# Patient Record
Sex: Female | Born: 2000 | Hispanic: Yes | Marital: Single | Smoking: Never smoker
Health system: Southern US, Community
[De-identification: ages and names within clinical notes are randomized; demographics above are authoritative.]

## PROBLEM LIST (undated history)

## (undated) DIAGNOSIS — D649 Anemia, unspecified: Secondary | ICD-10-CM

## (undated) HISTORY — PX: OVARIAN CYST SURGERY: SHX726

## (undated) HISTORY — DX: Anemia, unspecified: D64.9

---
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
## 2022-12-13 ENCOUNTER — Ambulatory Visit: Payer: BC Managed Care – PPO | Admitting: Family Medicine

## 2022-12-13 ENCOUNTER — Encounter: Payer: Self-pay | Admitting: Family Medicine

## 2022-12-13 VITALS — BP 119/80 | HR 52 | Temp 97.9°F | Ht 63.0 in | Wt 191.0 lb

## 2022-12-13 DIAGNOSIS — Z0000 Encounter for general adult medical examination without abnormal findings: Secondary | ICD-10-CM | POA: Diagnosis not present

## 2022-12-13 DIAGNOSIS — N912 Amenorrhea, unspecified: Secondary | ICD-10-CM

## 2022-12-13 DIAGNOSIS — D649 Anemia, unspecified: Secondary | ICD-10-CM | POA: Insufficient documentation

## 2022-12-13 DIAGNOSIS — Z7689 Persons encountering health services in other specified circumstances: Secondary | ICD-10-CM

## 2022-12-13 DIAGNOSIS — R7303 Prediabetes: Secondary | ICD-10-CM | POA: Diagnosis not present

## 2022-12-13 DIAGNOSIS — Z23 Encounter for immunization: Secondary | ICD-10-CM | POA: Diagnosis not present

## 2022-12-13 DIAGNOSIS — Z8481 Family history of carrier of genetic disease: Secondary | ICD-10-CM | POA: Diagnosis not present

## 2022-12-13 NOTE — Patient Instructions (Addendum)
Work on diet first if stool becomes harder again; if no improvement, can use over-the-counter stool softener, such as docusate sodium (generic for Colace).  Since you have zyrtec at home, you can take one tab every night before bed. If this dries you out too much, you can take one every other night.  If you can find and send me your insurance medication formulary, that would helpful.

## 2022-12-13 NOTE — Progress Notes (Signed)
New patient visit   Patient: Ruth Finley   DOB: [birth year]   22 y.o. Female  MRN: [id number] Visit Date: 12/13/2022  Today's healthcare provider: Sherlyn Hay, DO   Chief Complaint  Patient presents with   Establish Care   Subjective    Ruth Finley is a 22 y.o. female who presents today as a new patient to establish care.  HPI  Patient has additional complaint of amenorrhea for 4-5 months.  Patient has never been sexually active.  She does have a history of ovarian cysts that required surgery at age 22. Periods mostly regular, occasionally a couple days late but still happen. Usually last 3 days, with moderate flow. Started workout routine (30 minutes M-F), skip a week, then go again. Has been eating more often at home.  Trying to incorporate fresher foods and specifically homemade foods. Has been avoiding fast foods; had gizzards with rice yesterday; chicken, asparagus, potatoes, mushrooms and cabbage. Started 3-4 weeks ago. Initially 195 lb, now 191 lbs.  Patient also notes that she has seen blood in her stool on and off.  She has not seen any recently but does admit it is only when she has a bowel movement and she does suffer from constipation. - streak of blood on the stool and sometimes notable on toilet paper when she wipes; it is bright red. - BMs 1-2 x per days now; was having BMs q3d when constipated - Type 3-4 on [place] Chart - Works 3-11 pm; drinks more water at work than at home (works M-F)  Mom has breast cancer currently; was diagnosed at 20 Yo. - BRCA positive  Patient also states that in the past she has been told she is pre-diabetic.  She had an A1C of 5.9 on 09/23/21.  Palpitations - had a few weeks back; hasn't had any since. - doesn't remember exactly what she was doing. Was having a fair amount of stress - is fairly stressed by work.  Feels tension in shoulder and neck; gets headaches. 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 Wears glasses  but does have itchy eyes sometimes after work; rinses them with water.    Past Medical History:  Diagnosis Date   Anemia    Past Surgical History:  Procedure Laterality Date   OVARIAN CYST SURGERY     Age 22   Family Status  Relation Name Status   Mother  (Not Specified)   Father  (Not Specified)   MGM  (Not Specified)   Family History  Problem Relation Age of Onset   Cancer Mother        breast. dx in her 71's   Diabetes Father    Cancer Maternal Grandmother        breast   Social History   Socioeconomic History   Marital status: Single    Spouse name: Not on file   Number of children: Not on file   Years of education: Not on file   Highest education level: Not on file  Occupational History   Not on file  Tobacco Use   Smoking status: Never   Smokeless tobacco: Never  Substance and Sexual Activity   Alcohol use: Never   Drug use: Never   Sexual activity: Never    Birth control/protection: None  Other Topics Concern   Not on file  Social History Narrative   Not on file   Social Determinants of Health   Financial Resource Strain: Not  on file  Food Insecurity: No Food Insecurity (12/13/2022)   Hunger Vital Sign    Worried About Running Out of Food in the Last Year: Never true    Ran Out of Food in the Last Year: Never true  Transportation Needs: No Transportation Needs (12/13/2022)   PRAPARE - Administrator, Civil Service (Medical): No    Lack of Transportation (Non-Medical): No  Physical Activity: Sufficiently Active (12/13/2022)   Exercise Vital Sign    Days of Exercise per Week: 5 days    Minutes of Exercise per Session: 30 min  Stress: Not on file  Social Connections: Not on file   No outpatient medications prior to visit.   No facility-administered medications prior to visit.   No Known Allergies  Immunization History  Administered Date(s) Administered   PFIZER(Purple Top)SARS-COV-2 Vaccination 09/23/2019, 10/16/2019    Health  Maintenance  Topic Date Due   HPV VACCINES (1 - 2-dose series) Never done   HIV Screening  Never done   Hepatitis C Screening  Never done   COVID-19 Vaccine (3 - 2023-24 season) 02/18/2022   DTaP/Tdap/Td (1 - Tdap) Never done   INFLUENZA VACCINE  01/19/2023    Patient Care Team: Mickie Bail, MD (Inactive) as PCP - General (Pediatrics)  Review of Systems  Constitutional: Negative.   HENT:  Positive for tinnitus.   Eyes:  Positive for itching.  Respiratory: Negative.    Cardiovascular:  Positive for palpitations.  Gastrointestinal:  Positive for blood in stool and constipation.  Endocrine: Negative.   Genitourinary: Negative.   Musculoskeletal: Negative.   Skin: Negative.   Allergic/Immunologic: Negative.   Neurological:  Positive for headaches.  Hematological: Negative.   Psychiatric/Behavioral: Negative.    All other systems reviewed and are negative.      Objective    BP 119/80 (BP Location: Left Arm, Patient Position: Sitting, Cuff Size: Normal)   Pulse (!) 52   Temp 97.9 F (36.6 C) (Oral)   Ht 5\' 3"  (1.6 m)   Wt 191 lb (86.6 kg)   SpO2 100%   BMI 33.83 kg/m    Physical Exam Vitals reviewed.  Constitutional:      General: She is not in acute distress.    Appearance: Normal appearance. She is well-developed. She is not diaphoretic.  HENT:     Head: Normocephalic and atraumatic.     Right Ear: Tympanic membrane, ear canal and external ear normal.     Left Ear: Tympanic membrane, ear canal and external ear normal.     Nose: Nose normal.     Mouth/Throat:     Mouth: Mucous membranes are moist.     Pharynx: Oropharynx is clear. No oropharyngeal exudate.  Eyes:     General: No scleral icterus.    Conjunctiva/sclera: Conjunctivae normal.     Pupils: Pupils are equal, round, and reactive to light.  Neck:     Thyroid: No thyromegaly.  Cardiovascular:     Rate and Rhythm: Normal rate and regular rhythm.     Pulses: Normal pulses.     Heart sounds:  Normal heart sounds. No murmur heard. Pulmonary:     Effort: Pulmonary effort is normal. No respiratory distress.     Breath sounds: Normal breath sounds. No wheezing or rales.  Abdominal:     General: There is no distension.     Palpations: Abdomen is soft.     Tenderness: There is no abdominal tenderness.  Musculoskeletal:  General: No deformity.     Cervical back: Neck supple.     Right lower leg: No edema.     Left lower leg: No edema.  Lymphadenopathy:     Cervical: No cervical adenopathy.  Skin:    General: Skin is warm and dry.     Findings: No rash.  Neurological:     Mental Status: She is alert and oriented to person, place, and time. Mental status is at baseline.     Gait: Gait normal.  Psychiatric:        Mood and Affect: Mood normal.        Behavior: Behavior normal.        Thought Content: Thought content normal.     Depression Screen    12/13/2022    8:53 AM 03/20/2020    4:16 PM  PHQ 2/9 Scores  PHQ - 2 Score 0 0  PHQ- 9 Score 0    No results found for any visits on 12/13/22.  Assessment & Plan     1. Establishing care with new doctor, encounter for  2. Annual physical exam Overall benign physical exam except as noted.  Routine and screening blood work as noted below. - Comprehensive metabolic panel - Lipid panel - TSH Rfx on Abnormal to Free T4 - HIV Antibody (routine testing w rflx) - HCV Ab w Reflex to Quant PCR  3. Prediabetes Patient has long-standing history of prediabetes (since 03/11/2015 first ABN A1c of 5.7). Will recheck level today.  Patient working to improve her diet, weight and exercise habits with good initial success. Congratulated her and encouraged her to continue. - Hemoglobin A1c  4. Family history of BRCA gene positive Mom is BRCA positive and diagnosed with breast cancer at 20 Yo. Ordered BRCA genetic screening. Patient will need annual mammograms, starting at 22 Yo. [facility] Comprehensive Panel  5. Amenorrhea,  unspecified Patient has long-standing history of insulin resistance but only recently developed amenorrhea. Will evaluate with lab work below. - Follicle stimulating hormone - Prolactin - Estradiol - TSH Rfx on Abnormal to Free T4 - Testosterone,Free and Total  6. Immunization due Patient is up-to-date on all vaccines except Meningococcal B. Will give today. - Meningococcal B, OMV   Return in about 6 months (around 06/14/2023) for Weight.     The entirety of the information documented in the History of Present Illness, Review of Systems and Physical Exam were personally obtained by me. Portions of this information were initially documented by the CMA, Adline Peals, and reviewed by me for thoroughness and accuracy.   I discussed the assessment and treatment plan with the patient  The patient was provided an opportunity to ask questions and all were answered. The patient agreed with the plan and demonstrated an understanding of the instructions.   The patient was advised to call back or seek an in-person evaluation if the symptoms worsen or if the condition fails to improve as anticipated.    Sherlyn Hay, DO  [facility] [phone] (phone) [phone] (fax)  [facility]

## 2022-12-14 LAB — COMPREHENSIVE METABOLIC PANEL
AST: 21 IU/L (ref 0–40)
Calcium: 9.7 mg/dL (ref 8.7–10.2)
Sodium: 140 mmol/L (ref 134–144)

## 2022-12-14 LAB — BRCASSURE COMPREHENSIVE PANEL

## 2022-12-14 LAB — PROLACTIN: Prolactin: 139 ng/mL — ABNORMAL HIGH (ref 4.8–33.4)

## 2022-12-15 LAB — COMPREHENSIVE METABOLIC PANEL
ALT: 24 IU/L (ref 0–32)
Albumin: 4.9 g/dL (ref 4.0–5.0)
Alkaline Phosphatase: 65 IU/L (ref 42–106)
Bilirubin Total: 1 mg/dL (ref 0.0–1.2)
Chloride: 102 mmol/L (ref 96–106)
Globulin, Total: 3 g/dL (ref 1.5–4.5)
Glucose: 86 mg/dL (ref 70–99)
Total Protein: 7.9 g/dL (ref 6.0–8.5)
eGFR: 104 mL/min/{1.73_m2} (ref >59)

## 2022-12-15 LAB — LIPID PANEL
Chol/HDL Ratio: 4.7 ratio — ABNORMAL HIGH (ref 0.0–4.4)
Cholesterol, Total: 182 mg/dL — ABNORMAL HIGH (ref 100–169)
HDL: 39 mg/dL — ABNORMAL LOW (ref >39)
LDL Chol Calc (NIH): 122 mg/dL — ABNORMAL HIGH (ref 0–109)

## 2022-12-15 LAB — HCV INTERPRETATION

## 2022-12-15 LAB — HEMOGLOBIN A1C
Est. average glucose Bld gHb Est-mCnc: 123 mg/dL
Hgb A1c MFr Bld: 5.9 % — ABNORMAL HIGH (ref 4.8–5.6)

## 2022-12-15 LAB — TSH RFX ON ABNORMAL TO FREE T4: TSH: 2.26 u[IU]/mL (ref 0.450–4.500)

## 2022-12-15 LAB — FOLLICLE STIMULATING HORMONE: FSH: 7.5 m[IU]/mL

## 2022-12-15 LAB — BRCASSURE COMPREHENSIVE PANEL

## 2022-12-15 LAB — HCV AB W REFLEX TO QUANT PCR: HCV Ab: NONREACTIVE

## 2022-12-15 LAB — TESTOSTERONE,FREE AND TOTAL: Testosterone, Free: 4.1 pg/mL

## 2022-12-15 LAB — ESTRADIOL: Estradiol: 16.3 pg/mL

## 2023-01-05 ENCOUNTER — Inpatient Hospital Stay: Payer: BC Managed Care – PPO | Attending: Oncology | Admitting: Licensed Clinical Social Worker

## 2023-01-05 ENCOUNTER — Inpatient Hospital Stay: Payer: BC Managed Care – PPO

## 2023-01-12 ENCOUNTER — Inpatient Hospital Stay (HOSPITAL_BASED_OUTPATIENT_CLINIC_OR_DEPARTMENT_OTHER): Payer: BC Managed Care – PPO | Admitting: Licensed Clinical Social Worker

## 2023-01-12 ENCOUNTER — Inpatient Hospital Stay: Payer: BC Managed Care – PPO

## 2023-01-12 ENCOUNTER — Encounter: Payer: Self-pay | Admitting: Licensed Clinical Social Worker

## 2023-01-12 DIAGNOSIS — Z803 Family history of malignant neoplasm of breast: Secondary | ICD-10-CM

## 2023-01-12 DIAGNOSIS — Z8481 Family history of carrier of genetic disease: Secondary | ICD-10-CM

## 2023-01-12 NOTE — Progress Notes (Signed)
REFERRING PROVIDER: Self-referred  PRIMARY PROVIDER:  Mickie Bail, MD (Inactive)  PRIMARY REASON FOR VISIT:  1. Family history of BRCA gene positive (Mom dx at 20 Yo)   2. Family history of breast cancer     HISTORY OF PRESENT ILLNESS:   Ruth Finley, a 22 y.o. female, was seen for a [facility] cancer genetics consultation due to a family history of BRCA1 mutation.  Ruth Finley presents to clinic today to discuss the possibility of a hereditary predisposition to cancer, genetic testing, and to further clarify her future cancer risks, as well as potential cancer risks for family members.   CANCER HISTORY:  Ruth Finley is a 22 y.o. female with no personal history of cancer.    RISK FACTORS:  Menarche was at age 22.  Ovaries intact: yes.  Hysterectomy: no.  Menopausal status: premenopausal.    Past Medical History:  Diagnosis Date   Anemia     Past Surgical History:  Procedure Laterality Date   OVARIAN CYST SURGERY     Age 72    FAMILY HISTORY:  We obtained a detailed, 4-generation family history.  Significant diagnoses are listed below: Family History  Problem Relation Age of Onset   Breast cancer Mother 62       BRCA1+   Diabetes Father    Other Sister        BRCA1+   Breast cancer Maternal Grandmother 46   Ruth Finley has 1 full sister, Ruth Finley, who is BRCA1+. She has a maternal half sister.   Ruth Finley mother had breast cancer at 31 and is living at 67, she is also BRCA1+. Maternal grandmother had breast cancer at 8. Maternal 1st cousin once removed had breast cancer at 69. Maternal grandfather passed at 48, his brother had brain cancer.  Ruth Finley father is living at 54. A paternal uncle had prostate cancer. No other known cancers on this side of the family.  Ruth Finley is aware of previous family history of genetic testing for hereditary cancer risks. There is no reported Ashkenazi Jewish ancestry. There is no known consanguinity.    GENETIC  COUNSELING ASSESSMENT: Ruth Finley is a 22 y.o. female with a family history of a BRCA1 mutation. We, therefore, discussed and recommended the following at today's visit.   DISCUSSION: We discussed that approximately 10% of cancer is hereditary. We discussed the BRCA1 gene in detail, noting cancer risks and potential management changes associated. She has a 50% chance to have inherited this mutation from her mother. There are other genes associated with hereditary cancer as well. Cancers and risks are gene specific. We discussed that testing is beneficial for several reasons including knowing about cancer risks, identifying potential screening and risk-reduction options that may be appropriate, and to understand if other family members could be at risk for cancer and allow them to undergo genetic testing.   We reviewed the characteristics, features and inheritance patterns of hereditary cancer syndromes. We also discussed genetic testing, including the appropriate family members to test, the process of testing, insurance coverage and turn-around-time for results. We discussed the implications of a negative, positive and/or variant of uncertain significant result. We recommended Ruth Finley pursue genetic testing for the [facility] Multi-Cancer+RNA gene panel.   Based on Ruth Finley's family history of cancer and BRCA1 mutation, she meets medical criteria for genetic testing. Despite that she meets criteria, she may still have an out of pocket cost.   We discussed that some people do not want  to undergo genetic testing due to fear of genetic discrimination.  A federal law called the Genetic Information Non-Discrimination Act (GINA) of 2008 helps protect individuals against genetic discrimination based on their genetic test results.  It impacts both health insurance and employment.  For health insurance, it protects against increased premiums, being kicked off insurance or being forced to take a test in order to  be insured.  For employment it protects against hiring, firing and promoting decisions based on genetic test results.  Health status due to a cancer diagnosis is not protected under GINA.  This law does not protect life insurance, disability insurance, or other types of insurance.   PLAN: After considering the risks, benefits, and limitations, Ruth Finley provided informed consent to pursue genetic testing and the blood sample was sent to [facility] for analysis of the Multi-Cancer+RNA panel. Results should be available within approximately 2-3 weeks' time, at which point they will be disclosed by telephone to Ruth Finley, as will any additional recommendations warranted by these results. Ruth Finley will receive a summary of her genetic counseling visit and a copy of her results once available. This information will also be available in Epic.   Ruth Finley questions were answered to her satisfaction today. Our contact information was provided should additional questions or concerns arise. Thank you for the referral and allowing [name] to share in the care of your patient.   [name], MS, [facility] Genetic Counselor [email or web address] Phone: [phone]  The patient was seen for a total of 18 minutes in face-to-face genetic counseling.  Patient's mother was also present. Dr. Blake Divine was available for discussion regarding this case.   _______________________________________________________________________ For Office Staff:  Number of people involved in session: 2 Was an Intern/ student involved with case: no

## 2023-01-19 ENCOUNTER — Telehealth: Payer: Self-pay | Admitting: Licensed Clinical Social Worker

## 2023-01-30 ENCOUNTER — Encounter: Payer: Self-pay | Admitting: Licensed Clinical Social Worker

## 2023-01-30 ENCOUNTER — Ambulatory Visit: Payer: Self-pay | Admitting: Licensed Clinical Social Worker

## 2023-01-30 DIAGNOSIS — Z1379 Encounter for other screening for genetic and chromosomal anomalies: Secondary | ICD-10-CM | POA: Insufficient documentation

## 2023-01-30 NOTE — Telephone Encounter (Signed)
I contacted Ms. Skowron to discuss her genetic testing results. No pathogenic variants were identified in the 70 genes analyzed. Detailed clinic note to follow.   The test report has been scanned into EPIC and is located under the Molecular Pathology section of the Results Review tab.  A portion of the result report is included below for reference.      [name], MS, [facility] Genetic Counselor [email or web address] Phone: [phone]

## 2023-01-30 NOTE — Progress Notes (Signed)
HPI:   [name] was previously seen in the [facility] due to a family history of cancer, her mother's BRCA1 mutation, and concerns regarding a hereditary predisposition to cancer. Please refer to our prior cancer genetics clinic note for more information regarding our discussion, assessment and recommendations, at the time. [name] recent genetic test results were disclosed to her, as were recommendations warranted by these results. These results and recommendations are discussed in more detail below.  CANCER HISTORY:  Oncology History   No history exists.    FAMILY HISTORY:  We obtained a detailed, 4-generation family history.  Significant diagnoses are listed below: Family History  Problem Relation Age of Onset   Breast cancer Mother 70       BRCA1+   Diabetes Father    Other Sister        BRCA1+   Breast cancer Maternal Grandmother 38    [name] has 1 full sister, Ruth Finley, who is BRCA1+. She has a maternal half sister.    [name] mother had breast cancer at 14 and is living at 26, she is also BRCA1+. Maternal grandmother had breast cancer at 86. Maternal 1st cousin once removed had breast cancer at 76. Maternal grandfather passed at 59, his brother had brain cancer.   [name] father is living at 33. A paternal uncle had prostate cancer. No other known cancers on this side of the family.   [name] is aware of previous family history of genetic testing for hereditary cancer risks. There is no reported Ashkenazi Jewish ancestry. There is no known consanguinity.     GENETIC TEST RESULTS:  The [facility] Multi-Cancer+RNA Panel found no pathogenic mutations.   The Multi-Cancer + RNA Panel offered by [facility] includes sequencing and/or deletion/duplication analysis of the following 70 genes:  AIP*, ALK, APC*, ATM*, AXIN2*, BAP1*, BARD1*, BLM*, BMPR1A*, BRCA1*, BRCA2*, BRIP1*, CDC73*, CDH1*, CDK4, CDKN1B*, CDKN2A, CHEK2*, CTNNA1*,  DICER1*, EPCAM, EGFR, FH*, FLCN*, GREM1, HOXB13, KIT, LZTR1, MAX*, MBD4, MEN1*, MET, MITF, MLH1*, MSH2*, MSH3*, MSH6*, MUTYH*, NF1*, NF2*, NTHL1*, PALB2*, PDGFRA, PMS2*, POLD1*, POLE*, POT1*, PRKAR1A*, PTCH1*, PTEN*, RAD51C*, RAD51D*, RB1*, RET, SDHA*, SDHAF2*, SDHB*, SDHC*, SDHD*, SMAD4*, SMARCA4*, SMARCB1*, SMARCE1*, STK11*, SUFU*, TMEM127*, TP53*, TSC1*, TSC2*, VHL*. RNA analysis is performed for * genes.   The test report has been scanned into EPIC and is located under the Molecular Pathology section of the Results Review tab.  A portion of the result report is included below for reference. Genetic testing reported out on 01/19/2023.      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  At this time, it is unknown if this variant is associated with an increased risk for cancer or if it is benign, but most uncertain variants are reclassified to benign. It should not be used to make medical management decisions. With time, we suspect the laboratory will determine the significance of this variant, if any. If the laboratory reclassifies this variant, we will attempt to contact [name] to discuss it further.   We recommended [name] pursue testing for the familial hereditary cancer gene mutation called BRCA1 [id number] ([id number].Glu23Valfs*17). [name] test was normal and did not reveal the familial mutation. We call this result a true negative result because the cancer-causing mutation was identified in [name]'s family, and she did not inherit it.  Given this negative result, [name]'s chances of developing BRCA1-related cancers are the same as they are in the general  population.    ADDITIONAL GENETIC TESTING:  We discussed with [name] that her genetic testing was fairly extensive.  If there are additional relevant genes identified to increase cancer risk that can be analyzed in the future, we would be happy to discuss and coordinate this testing  at that time.    CANCER SCREENING RECOMMENDATIONS:  [name] test result is considered negative (normal).    An individual's cancer risk and medical management are not determined by genetic test results alone. Overall cancer risk assessment incorporates additional factors, including personal medical history, family history, and any available genetic information that may result in a personalized plan for cancer prevention and surveillance. Therefore, it is recommended she continue to follow the cancer management and screening guidelines provided by her primary healthcare provider.  RECOMMENDATIONS FOR FAMILY MEMBERS:   Individuals in this family might be at some increased risk of developing cancer, over the general population risk, due to the family history of cancer.  Individuals in the family should notify their providers of the family history of cancer. We recommend women in this family have a yearly mammogram beginning at age 68, or 45 years younger than the earliest onset of cancer, an annual clinical breast exam, and perform monthly breast self-exams.  Family members should have colonoscopies by at age 45, or earlier, as recommended by their providers. Other members of the family may still carry a pathogenic variant in one of these genes that [name] did not inherit. Based on the family history, we recommend her maternal relatives have genetic counseling and testing for the known familial BRCA1 mutation. [name] will let us know if we can be of any assistance in coordinating genetic counseling and/or testing for this family member.   We do not recommend familial testing for the PMS2 variant of uncertain significance (VUS).  FOLLOW-UP:  Lastly, we discussed with [name] that cancer genetics is a rapidly advancing field and it is possible that new genetic tests will be appropriate for her and/or her family members in the future. We encouraged her to remain in contact with cancer  genetics on an annual basis so we can update her personal and family histories and let her know of advances in cancer genetics that may benefit this family.   Our contact number was provided. [name] questions were answered to her satisfaction, and she knows she is welcome to call us at anytime with additional questions or concerns.    [name], MS, [facility] Genetic Counselor [email or web address] Phone: [phone]

## 2023-02-02 ENCOUNTER — Telehealth: Payer: Self-pay | Admitting: Family Medicine

## 2023-02-02 LAB — COMPREHENSIVE METABOLIC PANEL
BUN/Creatinine Ratio: 14 (ref 9–23)
BUN: 12 mg/dL (ref 6–20)
CO2: 21 mmol/L (ref 20–29)
Creatinine, Ser: 0.83 mg/dL (ref 0.57–1.00)
Potassium: 3.9 mmol/L (ref 3.5–5.2)

## 2023-02-02 LAB — LIPID PANEL
Triglycerides: 117 mg/dL — ABNORMAL HIGH (ref 0–89)
VLDL Cholesterol Cal: 21 mg/dL (ref 5–40)

## 2023-02-02 LAB — TESTOSTERONE,FREE AND TOTAL: Testosterone: 41 ng/dL (ref 13–71)

## 2023-02-02 LAB — HIV ANTIBODY (ROUTINE TESTING W REFLEX): HIV Screen 4th Generation wRfx: NONREACTIVE

## 2023-02-02 NOTE — Telephone Encounter (Signed)
[facility] Is calling to report that the patient no showed for 01/30/2023 [facility] appt. And can not be reached by phone.

## 2023-06-12 ENCOUNTER — Ambulatory Visit: Payer: BC Managed Care – PPO | Admitting: Family Medicine

## 2023-06-12 NOTE — Progress Notes (Deleted)
      Established patient visit   Patient: Ruth Finley   DOB: 2001/02/08   22 y.o. Female  MRN: [id number] Visit Date: 06/12/2023  Today's healthcare provider: Sherlyn Hay, DO   No chief complaint on file.  Subjective    HPI Last annual exam: 12/13/2022 Had genetics evaluation done July 2024 and was negative for pathogenic mutations.  Follow-up visit for weight. Of note, patient was bradycardic at 52 bpm at her last visit. Weight was 191 pounds  Vaccines?   ***  {History (Optional):23778}  Medications: No outpatient medications prior to visit.   No facility-administered medications prior to visit.    Review of Systems  ***  {Insert previous labs (optional):23779} {See past labs  Heme  Chem  Endocrine  Serology  Results Review (optional):1}   Objective    There were no vitals taken for this visit. {Insert last BP/Wt (optional):23777}{See vitals history (optional):1}   Physical Exam    No results found for any visits on 06/12/23.  Assessment & Plan    There are no diagnoses linked to this encounter.   ***  No follow-ups on file.      I discussed the assessment and treatment plan with the patient  The patient was provided an opportunity to ask questions and all were answered. The patient agreed with the plan and demonstrated an understanding of the instructions.   The patient was advised to call back or seek an in-person evaluation if the symptoms worsen or if the condition fails to improve as anticipated.    Sherlyn Hay, DO  [facility] [phone] (phone) [phone] (fax)  [facility]

## 2024-01-05 ENCOUNTER — Ambulatory Visit: Payer: Self-pay

## 2024-01-05 NOTE — Telephone Encounter (Signed)
 FYI Only or Action Required?: FYI only for provider.  Patient was last seen in primary care on 12/13/2022 by Donzella Lauraine SAILOR, DO.  Called Nurse Triage reporting Neck Pain.  Symptoms began several days ago.  Interventions attempted: OTC medications: Muscle and Joint cream.  Symptoms are: gradually improving.  Triage Disposition: See PCP When Office is Open (Within 3 Days)- no appts avail. Pt says she will go to an urgent care.  Patient/caregiver understands and will follow disposition?: Yes   Copied from CRM [id number]. Topic: Clinical - Red Word Triage >> Jan 05, 2024 10:51 AM Montie POUR wrote: Red Word that prompted transfer to Nurse Triage:  Neck pain down to her shoulders; pain level is 8; She might have strained it at work Reason for Disposition  [1] MODERATE neck pain (e.g., interferes with normal activities) AND [2] present > 3 days  Answer Assessment - Initial Assessment Questions 1. ONSET: When did the pain begin?      2 days now  2. LOCATION: Where does it hurt?      Back of neck, mostly on the left and extends to shoulder  3. PATTERN Does the pain come and go, or has it been constant since it started?      Pain comes and goes  4. SEVERITY: How bad is the pain?  (Scale 0-10; or none or slight stiffness, mild, moderate, severe)     8/10 pain  5. RADIATION: Does the pain go anywhere else, shoot into your arms?     Pain is also in shoulders  6. CORD SYMPTOMS: Any weakness or numbness of the arms or legs?     No  7. CAUSE: What do you think is causing the neck pain?     Thinks she may have strained it at work  8. NECK OVERUSE: Any recent activities that involved turning or twisting the neck?     Does a lot of twisting and turning of neck. Works in [employer]  9. OTHER SYMPTOMS: Do you have any other symptoms? (e.g., headache, fever, chest pain, difficulty breathing, neck swelling)     No  10. PREGNANCY: Is there any chance you are pregnant? When  was your last menstrual period?       No, LMP 2 wks ago  Protocols used: Neck Pain or Stiffness-A-AH

## 2024-01-05 NOTE — Telephone Encounter (Signed)
 FYI

## 2024-01-23 ENCOUNTER — Encounter: Admitting: Family Medicine

## 2024-01-24 ENCOUNTER — Ambulatory Visit (INDEPENDENT_AMBULATORY_CARE_PROVIDER_SITE_OTHER): Admitting: Family Medicine

## 2024-01-24 ENCOUNTER — Encounter: Payer: Self-pay | Admitting: Family Medicine

## 2024-01-24 VITALS — BP 107/73 | HR 62 | Temp 97.8°F | Ht 63.0 in | Wt 194.8 lb

## 2024-01-24 DIAGNOSIS — R7989 Other specified abnormal findings of blood chemistry: Secondary | ICD-10-CM

## 2024-01-24 DIAGNOSIS — D649 Anemia, unspecified: Secondary | ICD-10-CM

## 2024-01-24 DIAGNOSIS — R7309 Other abnormal glucose: Secondary | ICD-10-CM | POA: Insufficient documentation

## 2024-01-24 DIAGNOSIS — R7303 Prediabetes: Secondary | ICD-10-CM | POA: Diagnosis not present

## 2024-01-24 DIAGNOSIS — E785 Hyperlipidemia, unspecified: Secondary | ICD-10-CM

## 2024-01-24 DIAGNOSIS — Z130 Encounter for screening for diseases of the blood and blood-forming organs and certain disorders involving the immune mechanism: Secondary | ICD-10-CM

## 2024-01-24 DIAGNOSIS — Z0001 Encounter for general adult medical examination with abnormal findings: Secondary | ICD-10-CM

## 2024-01-24 DIAGNOSIS — K5900 Constipation, unspecified: Secondary | ICD-10-CM

## 2024-01-24 DIAGNOSIS — Z0000 Encounter for general adult medical examination without abnormal findings: Secondary | ICD-10-CM

## 2024-01-24 NOTE — Progress Notes (Signed)
 Complete physical exam   Patient: Ruth Finley   DOB: [birth year]   22 y.o. Female  MRN: [id number] Visit Date: 01/24/2024  Today's healthcare provider: LAURAINE LOISE BUOY, DO   Chief Complaint  Patient presents with   Annual Exam    Diet- general, unhealthy Exercise- Not really Feeling- Good Sleep- Good at least 8 hours every night Concerns- None  Hepatitis B Vaccine- received when she was a child.   Subjective    Ruth Finley is a 23 y.o. female who presents today for a complete physical exam.   HPI HPI     Annual Exam    Additional comments: Diet- general, unhealthy Exercise- Not really Feeling- Good Sleep- Good at least 8 hours every night Concerns- None  Hepatitis B Vaccine- received when she was a child.      Last edited by Terrel Powell CROME, CMA on 01/24/2024  8:38 AM.       Ruth Finley is a 23 year old female who presents for an annual physical exam.  She is no longer experiencing neck pain, which she attributes to possibly sleeping badly. The neck pain was present previously but has since resolved.  She has a history of elevated prolactin levels noted during a previous visit. She did not follow up on this issue, and it will be rechecked. Her menstrual periods returned in February and have been consistent since then, lasting between three to seven days, and are described as moderate in flow. She experiences occasional lightheadedness when standing up quickly during her period but denies any heavy bleeding.  Constipation comes and goes.  She has a history of anemia, which was identified a few years ago. She is currently not on any consistent medications except for occasional use of Excedrin for migraines, which she last took about two weeks ago.  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 She is not currently sexually  active and does not consume alcohol.    Past Medical History:  Diagnosis Date   Anemia    Past Surgical History:  Procedure Laterality Date   OVARIAN CYST SURGERY     Age 74   Social History   Socioeconomic History   Marital status: Single    Spouse name: Not on file   Number of children: Not on file   Years of education: Not on file   Highest education level: GED or equivalent  Occupational History   Not on file  Tobacco Use   Smoking status: Never   Smokeless tobacco: Never  Substance and Sexual Activity   Alcohol use: Never   Drug use: Never   Sexual activity: Never    Birth control/protection: None  Other Topics Concern   Not on file  Social History Narrative   Not on file   Social Drivers of Health   Financial Resource Strain: Low Risk  (06/11/2023)   Overall Financial Resource Strain (CARDIA)    Difficulty of Paying Living Expenses: Not hard at all  Food Insecurity: No Food Insecurity (06/11/2023)   Hunger Vital Sign    Worried About Running Out of Food in the Last Year: Never true    Ran Out of Food in the Last Year: Never true  Transportation Needs: No Transportation Needs (06/11/2023)   PRAPARE - Administrator, Civil Service (Medical): No    Lack of Transportation (Non-Medical):  No  Physical Activity: Inactive (06/11/2023)   Exercise Vital Sign    Days of Exercise per Week: 0 days    Minutes of Exercise per Session: 30 min  Stress: Stress Concern Present (06/11/2023)   Ruth Finley of Occupational Health - Occupational Stress Questionnaire    Feeling of Stress : To some extent  Social Connections: Moderately Isolated (01/24/2024)   Social Connection and Isolation Panel    Frequency of Communication with Friends and Family: Three times a week    Frequency of Social Gatherings with Friends and Family: Twice a week    Attends Religious Services: More than 4 times per year    Active Member of [employer] or Organizations: No    Attends Occupational hygienist Meetings: Never    Marital Status: Never married  Intimate Partner Violence: Not At Risk (12/13/2022)   Humiliation, Afraid, Rape, and Kick questionnaire    Fear of Current or Ex-Partner: No    Emotionally Abused: No    Physically Abused: No    Sexually Abused: No   Family Status  Relation Name Status   Mother Raoul Leos Alive   Father Nigeria Lasseter Alive   Sister Corean Alive   MGM  Alive   MGF  Deceased   PGM  Alive   PGF  Alive  No partnership data on file   Family History  Problem Relation Age of Onset   Breast cancer Mother 71       BRCA1+   Cancer Mother    Diabetes Father    Other Sister        BRCA1+   Breast cancer Maternal Grandmother 45   No Known Allergies  Patient Care Team: Larri Brewton, Lauraine SAILOR, DO as PCP - General (Family Medicine) Jacobo Evalene PARAS, MD as Consulting Physician (Oncology)   Medications: No outpatient medications prior to visit.   No facility-administered medications prior to visit.    Review of Systems  Constitutional:  Negative for chills, fatigue and fever.  HENT:  Negative for congestion, ear pain, rhinorrhea, sneezing and sore throat.   Eyes: Negative.  Negative for pain and redness.  Respiratory:  Negative for cough, shortness of breath and wheezing.   Cardiovascular:  Negative for chest pain and leg swelling.  Gastrointestinal:  Positive for constipation (intermittent, improved with increased fiber intake). Negative for abdominal pain, blood in stool, diarrhea and nausea.  Endocrine: Negative for polydipsia and polyphagia.  Genitourinary: Negative.  Negative for dysuria, flank pain, hematuria, pelvic pain, vaginal bleeding and vaginal discharge.  Musculoskeletal:  Negative for arthralgias, back pain, gait problem and joint swelling.  Skin:  Negative for rash.  Neurological: Negative.  Negative for dizziness, tremors, seizures, weakness, light-headedness (intermittent, during menstrual cycle, when standing  quickly.), numbness and headaches.  Hematological:  Negative for adenopathy.  Psychiatric/Behavioral: Negative.  Negative for behavioral problems, confusion and dysphoric mood. The patient is not nervous/anxious and is not hyperactive.       Objective    BP 107/73 (BP Location: Right Arm, Patient Position: Sitting, Cuff Size: Normal)   Pulse 62   Temp 97.8 F (36.6 C) (Oral)   Ht 5' 3 (1.6 m)   Wt 194 lb 12.8 oz (88.4 kg)   SpO2 100%   BMI 34.51 kg/m    Physical Exam Vitals and nursing note reviewed.  Constitutional:      General: She is awake.     Appearance: Normal appearance.  HENT:     Head: Normocephalic and atraumatic.  Right Ear: Tympanic membrane, ear canal and external ear normal.     Left Ear: Tympanic membrane, ear canal and external ear normal.     Nose: Nose normal.     Mouth/Throat:     Mouth: Mucous membranes are moist.     Pharynx: Oropharynx is clear. No oropharyngeal exudate or posterior oropharyngeal erythema.  Eyes:     General: No scleral icterus.    Extraocular Movements: Extraocular movements intact.     Conjunctiva/sclera: Conjunctivae normal.     Pupils: Pupils are equal, round, and reactive to light.  Neck:     Thyroid : No thyromegaly or thyroid  tenderness.  Cardiovascular:     Rate and Rhythm: Normal rate and regular rhythm.     Pulses: Normal pulses.     Heart sounds: Normal heart sounds.  Pulmonary:     Effort: Pulmonary effort is normal. No tachypnea, bradypnea or respiratory distress.     Breath sounds: Normal breath sounds. No stridor. No wheezing, rhonchi or rales.  Abdominal:     General: Bowel sounds are normal. There is no distension.     Palpations: Abdomen is soft. There is no mass.     Tenderness: There is no abdominal tenderness. There is no guarding.     Hernia: No hernia is present.  Musculoskeletal:     Cervical back: Normal range of motion and neck supple.     Right lower leg: No edema.     Left lower leg: No edema.   Lymphadenopathy:     Cervical: No cervical adenopathy.  Skin:    General: Skin is warm and dry.  Neurological:     Mental Status: She is alert and oriented to person, place, and time. Mental status is at baseline.  Psychiatric:        Mood and Affect: Mood normal.        Behavior: Behavior normal.      Last depression screening scores    12/13/2022    8:53 AM 03/20/2020    4:16 PM  PHQ 2/9 Scores  PHQ - 2 Score 0 0  PHQ- 9 Score 0    Last fall risk screening    12/13/2022    8:53 AM  Fall Risk   Falls in the past year? 0  Number falls in past yr: 0  Injury with Fall? 0   Last Audit-C alcohol use screening    06/11/2023   12:48 PM  Alcohol Use Disorder Test (AUDIT)  1. How often do you have a drink containing alcohol? 0  3. How often do you have six or more drinks on one occasion? 0   A score of 3 or more in women, and 4 or more in men indicates increased risk for alcohol abuse, EXCEPT if all of the points are from question 1   No results found for any visits on 01/24/24.  Assessment & Plan    Routine Health Maintenance and Physical Exam  Exercise Activities and Dietary recommendations  Goals      Increase physical activity        Immunization History  Administered Date(s) Administered   Dtap, Unspecified 04/18/2003, 07/08/2003, 09/03/2003, 09/14/2004, 04/13/2007   HIB, Unspecified 04/18/2003, 07/08/2003, 09/03/2003, 07/23/2004   HPV 9-valent 03/11/2015, 07/28/2016   Hep A, Unspecified 01/04/2008, 02/10/2009   Hep B, Unspecified 2003-01-04, 04/18/2003, 12/03/2003   IPV 04/18/2003, 07/08/2003, 09/14/2004, 04/13/2007   Influenza,inj,Quad PF,6+ Mos 07/28/2016   MMR 03/04/2004, 04/13/2007   Meningococcal B, OMV 01/16/2020, 12/13/2022  Meningococcal Conjugate 03/11/2015, 01/16/2020   PFIZER(Purple Top)SARS-COV-2 Vaccination 09/23/2019, 10/16/2019   Pneumococcal Conjugate-13 05/27/2003, 07/08/2003, 09/03/2003, 07/23/2004   Tdap 03/11/2015   Varicella  03/04/2004, 04/13/2007    Health Maintenance  Topic Date Due   INFLUENZA VACCINE  09/17/2024 (Originally 01/19/2024)   Hepatitis B Vaccines (1 of 3 - 19+ 3-dose series) 01/23/2025 (Originally 03/04/2022)   COVID-19 Vaccine (3 - 2024-25 season) 01/23/2025 (Originally 02/19/2023)   DTaP/Tdap/Td (7 - Td or Tdap) 03/10/2025   Pneumococcal Vaccine: 19-49 Years  Completed   HPV VACCINES  Completed   Hepatitis C Screening  Completed   HIV Screening  Completed   Meningococcal B Vaccine  Completed    Discussed health benefits of physical activity, and encouraged her to engage in regular exercise appropriate for her age and condition.   Annual physical exam  Dyslipidemia (high LDL; low HDL) -     Lipid panel  Anemia, unspecified type -     CBC  Prediabetes -     Hemoglobin A1c  Screening for endocrine, metabolic and immunity disorder -     Comprehensive metabolic panel with GFR -     TSH Rfx on Abnormal to Free T4  Constipation, unspecified constipation type -     TSH Rfx on Abnormal to Free T4  Elevated prolactin level -     Prolactin     Annual physical exam Routine wellness visit with no complaints. Physical exam overall unremarkable except as noted above. Routine lab work ordered as noted. Menstrual cycles regular (improved from previous infrequent cycles). No symptoms reported. No alcohol or sexual activity. Active lifestyle with plans to improve fitness and diet. - Discontinued hepatitis B vaccine as all childhood vaccinations were received. - Encouraged stretching and range of motion exercises. - Advised on maintaining a healthy diet with emphasis on fruits, vegetables, and whole grains. - Administer flu shot during vaccine clinics. - Advise Pap smear next year. - Encourage use of protection if becoming sexually active.  Elevated prolactin level Previously noted elevated prolactin level of 139 without follow-up. Possible causes include stress, medication, or marijuana  exposure. Level requires re-evaluation. - Recheck prolactin level.  Anemia History of anemia with no current symptoms, except during menstrual cycles. - Check complete blood count.    Return in about 1 year (around 01/23/2025) for CPE.     I discussed the assessment and treatment plan with the patient  The patient was provided an opportunity to ask questions and all were answered. The patient agreed with the plan and demonstrated an understanding of the instructions.   The patient was advised to call back or seek an in-person evaluation if the symptoms worsen or if the condition fails to improve as anticipated.    LAURAINE LOISE BUOY, DO  [facility] [phone] (phone) [phone] (fax)  [facility]

## 2024-01-25 LAB — COMPREHENSIVE METABOLIC PANEL WITH GFR
ALT: 27 IU/L (ref 0–32)
AST: 20 IU/L (ref 0–40)
Albumin: 4.7 g/dL (ref 4.0–5.0)
Alkaline Phosphatase: 56 IU/L (ref 42–106)
BUN/Creatinine Ratio: 16 (ref 9–23)
BUN: 12 mg/dL (ref 6–20)
Bilirubin Total: 0.4 mg/dL (ref 0.0–1.2)
CO2: 21 mmol/L (ref 20–29)
Calcium: 9.6 mg/dL (ref 8.7–10.2)
Chloride: 103 mmol/L (ref 96–106)
Creatinine, Ser: 0.77 mg/dL (ref 0.57–1.00)
Globulin, Total: 2.8 g/dL (ref 1.5–4.5)
Glucose: 93 mg/dL (ref 70–99)
Potassium: 4.5 mmol/L (ref 3.5–5.2)
Sodium: 140 mmol/L (ref 134–144)
Total Protein: 7.5 g/dL (ref 6.0–8.5)
eGFR: 113 mL/min/1.73 (ref >59)

## 2024-01-25 LAB — CBC
Hematocrit: 40.9 % (ref 34.0–46.6)
Hemoglobin: 13.3 g/dL (ref 11.1–15.9)
MCH: 28.8 pg (ref 26.6–33.0)
MCHC: 32.5 g/dL (ref 31.5–35.7)
MCV: 89 fL (ref 79–97)
Platelets: 241 x10E3/uL (ref 150–450)
RBC: 4.62 x10E6/uL (ref 3.77–5.28)
RDW: 13.4 % (ref 11.7–15.4)
WBC: 8.1 x10E3/uL (ref 3.4–10.8)

## 2024-01-25 LAB — LIPID PANEL
Chol/HDL Ratio: 5 ratio — ABNORMAL HIGH (ref 0.0–4.4)
Cholesterol, Total: 181 mg/dL (ref 100–199)
HDL: 36 mg/dL — ABNORMAL LOW (ref >39)
LDL Chol Calc (NIH): 115 mg/dL — ABNORMAL HIGH (ref 0–99)
Triglycerides: 167 mg/dL — ABNORMAL HIGH (ref 0–149)
VLDL Cholesterol Cal: 30 mg/dL (ref 5–40)

## 2024-01-25 LAB — PROLACTIN: Prolactin: 176 ng/mL — ABNORMAL HIGH (ref 4.8–33.4)

## 2024-01-25 LAB — HEMOGLOBIN A1C
Est. average glucose Bld gHb Est-mCnc: 117 mg/dL
Hgb A1c MFr Bld: 5.7 % — ABNORMAL HIGH (ref 4.8–5.6)

## 2024-01-25 LAB — TSH RFX ON ABNORMAL TO FREE T4: TSH: 2.61 u[IU]/mL (ref 0.450–4.500)

## 2024-01-31 ENCOUNTER — Ambulatory Visit: Payer: Self-pay | Admitting: Family Medicine

## 2024-01-31 DIAGNOSIS — E221 Hyperprolactinemia: Secondary | ICD-10-CM

## 2024-02-01 ENCOUNTER — Encounter: Admitting: Family Medicine

## 2024-02-05 ENCOUNTER — Encounter: Admitting: Family Medicine

## 2024-02-05 ENCOUNTER — Ambulatory Visit

## 2024-02-06 ENCOUNTER — Ambulatory Visit: Admission: RE | Admit: 2024-02-06 | Payer: Self-pay | Source: Ambulatory Visit

## 2024-02-08 ENCOUNTER — Telehealth: Payer: Self-pay

## 2024-02-08 NOTE — Telephone Encounter (Signed)
 Copied from CRM #[id number]. Topic: General - Other >> Feb 08, 2024  1:53 PM Emylou G wrote: Reason for CRM: Patient called.. said [facility] keeps rescheduling due to prior auth not getting approved - make sure her ins set to active possible on her chart.. Pls call patient

## 2024-02-09 ENCOUNTER — Ambulatory Visit

## 2024-02-12 NOTE — Telephone Encounter (Signed)
 Please see the message below.

## 2024-02-16 ENCOUNTER — Ambulatory Visit
Admission: RE | Admit: 2024-02-16 | Discharge: 2024-02-16 | Disposition: A | Discharge status: Home / Self Care | Source: Ambulatory Visit | Attending: Family Medicine | Admitting: Family Medicine

## 2024-02-16 DIAGNOSIS — E221 Hyperprolactinemia: Secondary | ICD-10-CM | POA: Insufficient documentation

## 2024-02-16 MED ORDER — GADOBUTROL 1 MMOL/ML IV SOLN
9.0000 mL | Freq: Once | INTRAVENOUS | Status: AC | PRN
  Administered 2024-02-16: 9 mL via INTRAVENOUS

## 2024-02-26 ENCOUNTER — Telehealth: Payer: Self-pay

## 2024-02-26 NOTE — Telephone Encounter (Signed)
 Copied from CRM [id number]. Topic: Clinical - Lab/Test Results >> Feb 26, 2024  9:04 AM Willma SAUNDERS wrote: Reason for CRM: Diane from [facility] calling to make sure Dr Donzella has seen her imagine results especially Impression # 1.   Diane can be reached at [phone] for additional questions.

## 2024-02-28 ENCOUNTER — Ambulatory Visit: Payer: Self-pay | Admitting: Family Medicine

## 2024-02-28 DIAGNOSIS — E221 Hyperprolactinemia: Secondary | ICD-10-CM

## 2024-02-28 DIAGNOSIS — D352 Benign neoplasm of pituitary gland: Secondary | ICD-10-CM

## 2024-03-08 ENCOUNTER — Encounter: Payer: Self-pay | Admitting: Neurosurgery

## 2024-03-08 ENCOUNTER — Ambulatory Visit (INDEPENDENT_AMBULATORY_CARE_PROVIDER_SITE_OTHER): Admitting: Neurosurgery

## 2024-03-08 VITALS — BP 117/79 | HR 62 | Ht 63.0 in | Wt 196.0 lb

## 2024-03-08 DIAGNOSIS — D352 Benign neoplasm of pituitary gland: Secondary | ICD-10-CM | POA: Diagnosis not present

## 2024-03-08 NOTE — Progress Notes (Signed)
 Assessment : 23 year old formerly healthy lady who in 2024, had an episode of 6 months where she went amenorrheic.  At that time she was seen by her primary care doctor and workup was done and she was found to have an elevated prolactin level up to 139.  No medical management at that point was initiated and follow-up was recommended.  After 6 months, her periods returned and she is having regular periods now.  Next  Repeat blood levels were drawn and the prolactin level had risen to 176. 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  Patient explicitly says that she does not have any breast tenderness nor does she have any galactorrhea.  All the other signs and symptoms of pituitary dysfunction are negative.  Patient works in Public relations account executive and lives with her parents and was accompanied by her mom today.  Plan : I went over the imaging with her and her mom and we talked about the lab values.  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  Given the fact that she does not have any breast tenderness nor does she have any galactorrhea I am hard pressed to recommend any treatment for the prolactin and in the absence of any visual problems nor any headaches, I do not think that operating on this adenoma would be justified.  I did offer her an evaluation by our endocrinology team and she declined that.  To that end, we decided that I am going to see her back in 1 year at which point we will repeat the prolactin level as well as an MRI.  I explicitly told her and her mom that if she starts having any headaches, starts having breast tenderness or galactorrhea to let me know immediately.  She pledged to do so I look forward to seeing her back in 1 year.   Social History   Socioeconomic  History   Marital status: Single    Spouse name: Not on file   Number of children: Not on file   Years of education: Not on file   Highest education level: GED or equivalent  Occupational History   Not on file  Tobacco Use   Smoking status: Never   Smokeless tobacco: Never  Substance and Sexual Activity   Alcohol use: Never   Drug use: Never   Sexual activity: Never    Birth control/protection: None  Other Topics Concern   Not on file  Social History Narrative   Not on file   Social Drivers of Health   Financial Resource Strain: Low Risk  (06/11/2023)   Overall Financial Resource Strain (CARDIA)    Difficulty of Paying Living Expenses: Not hard at all  Food Insecurity: No Food Insecurity (06/11/2023)   Hunger Vital Sign    Worried About Running Out of Food in the Last Year: Never true    Ran Out of Food in the Last Year: Never true  Transportation Needs: No Transportation Needs (06/11/2023)   PRAPARE - Administrator, Civil Service (Medical): No    Lack of Transportation (Non-Medical): No  Physical Activity: Inactive (06/11/2023)   Exercise Vital Sign    Days of Exercise per Week: 0 days    Minutes of Exercise per Session: 30 min  Stress: Stress Concern Present (06/11/2023)   [employer] of Occupational Health - Occupational Stress Questionnaire    Feeling of Stress : To some extent  Social Connections: Moderately Isolated (01/24/2024)   Social Connection and Isolation Panel    Frequency of Communication with Friends and Family: Three times a week    Frequency of Social Gatherings with Friends and Family: Twice a week    Attends Religious Services: More than 4 times per year    Active Member of [employer] or Organizations: No    Attends Banker Meetings: Never    Marital Status: Never married  Intimate Partner Violence: Not At Risk (12/13/2022)   Humiliation, Afraid, Rape, and Kick questionnaire    Fear of Current or Ex-Partner: No     Emotionally Abused: No    Physically Abused: No    Sexually Abused: No    Family History  Problem Relation Age of Onset   Breast cancer Mother 18       BRCA1+   Cancer Mother    Diabetes Father    Other Sister        BRCA1+   Breast cancer Maternal Grandmother 45    No Known Allergies  Past Medical History:  Diagnosis Date   Anemia     Past Surgical History:  Procedure Laterality Date   OVARIAN CYST SURGERY     Age 80     Physical Exam HENT:     Head: Normocephalic.     Nose: Nose normal.  Eyes:     Pupils: Pupils are equal, round, and reactive to light.  Cardiovascular:     Rate and Rhythm: Normal rate.  Pulmonary:     Effort: Pulmonary effort is normal.  Abdominal:     General: Abdomen is flat.  Musculoskeletal:     Cervical back: Normal range of motion.  Neurological:     Mental Status: She is alert.     Cranial Nerves: Cranial nerves 2-12 are intact.     Sensory: Sensation is intact.     Motor: Motor function is intact.     Coordination: Coordination is intact.        Results for orders placed or performed during the hospital encounter of 02/16/24  MR Brain W Wo Contrast   Narrative   CLINICAL DATA:  Provided history: Hyperprolactinemia.  EXAM: MRI HEAD WITHOUT AND WITH CONTRAST  TECHNIQUE: Multiplanar, multiecho pulse sequences of the brain and surrounding structures were obtained without and with intravenous contrast.  CONTRAST:  9mL GADAVIST  GADOBUTROL  1 MMOL/ML IV SOLN  COMPARISON:  None.  FINDINGS: Brain:  Pituitary protocol sequences were acquired, including dynamic post-contrast imaging. 8 mm hypoenhancing lesion within the right aspect of the pituitary gland. This is most consistent with a pituitary adenoma. No suprasellar or cavernous sinus extension. Mild leftward deviation of pituitary stalk.  Cerebral volume is normal.  No cortical encephalomalacia is identified. No significant cerebral white matter disease.  There  is no acute infarct.  No chronic intracranial blood products.  No extra-axial fluid collection.  No midline shift.  Vascular: Maintained flow voids within the proximal large arterial vessels.  Skull and upper cervical spine: No focal worrisome lesion.  Sinuses/Orbits: No mass or acute finding within the imaged orbits. No significant paranasal sinus disease.  Impression #1 will be called to the ordering clinician or representative by the Radiologist Assistant, and communication documented in the PACS or [employer].  IMPRESSION: 1. 8 mm hypoenhancing pituitary lesion consistent with an  adenoma. No suprasellar or cavernous sinus extension. Mild leftward deviation of the pituitary stalk. 2. Otherwise unremarkable MRI appearance of the brain.   Electronically Signed   By: Rockey Childs D.O.   On: 02/23/2024 19:30

## 2024-03-19 ENCOUNTER — Ambulatory Visit

## 2024-03-20 ENCOUNTER — Encounter: Payer: Self-pay | Admitting: Emergency Medicine

## 2024-03-20 ENCOUNTER — Ambulatory Visit
Admission: EM | Admit: 2024-03-20 | Discharge: 2024-03-20 | Disposition: A | Discharge status: Home / Self Care | Attending: Emergency Medicine | Admitting: Emergency Medicine

## 2024-03-20 ENCOUNTER — Ambulatory Visit

## 2024-03-20 DIAGNOSIS — H60392 Other infective otitis externa, left ear: Secondary | ICD-10-CM | POA: Diagnosis not present

## 2024-03-20 MED ORDER — CIPROFLOXACIN-DEXAMETHASONE 0.3-0.1 % OT SUSP: 4.0000 [drp] | Freq: Two times a day (BID) | OTIC | 0 refills | Status: AC

## 2024-03-20 NOTE — ED Provider Notes (Signed)
 [facility]    CSN: [id number] Arrival date & time: 03/20/24  9167      History   Chief Complaint Chief Complaint  Patient presents with   Otalgia    HPI Ruth Finley is a 23 y.o. female.   Patient presents for evaluation of left-sided ear pain, left sided jaw pain and pain on the left side of the neck beginning 3 days ago.  Decreased appetite due to jaw pain but tolerable to some food and liquids.  Has taken Tylenol without improvement.  Denies fever or congestion.  Past Medical History:  Diagnosis Date   Anemia     Patient Active Problem List   Diagnosis Date Noted   Elevated hemoglobin A1c 01/24/2024   Dyslipidemia (high LDL; low HDL) 01/24/2024   Genetic testing 01/30/2023   Prediabetes 12/13/2022   Family history of BRCA gene positive (Mom dx at 37 Yo) 12/13/2022   Anemia 12/13/2022   Insulin resistance syndrome 01/16/2020   Acne vulgaris 03/11/2015   Overweight 03/11/2015    Past Surgical History:  Procedure Laterality Date   OVARIAN CYST SURGERY     Age 38    OB History   No obstetric history on file.      Home Medications    Prior to Admission medications   Medication Sig Start Date End Date Taking? Authorizing Provider  ciprofloxacin-dexamethasone (CIPRODEX) OTIC suspension Place 4 drops into the left ear 2 (two) times daily. 03/20/24  Yes Lyza Houseworth, Shelba SAUNDERS, NP    Family History Family History  Problem Relation Age of Onset   Breast cancer Mother 65       BRCA1+   Cancer Mother    Diabetes Father    Other Sister        BRCA1+   Breast cancer Maternal Grandmother 12    Social History Social History   Tobacco Use   Smoking status: Never   Smokeless tobacco: Never  Substance Use Topics   Alcohol use: Never   Drug use: Never     Allergies   Patient has no known allergies.   Review of Systems Review of Systems  HENT:  Positive for ear pain.      Physical Exam Triage Vital Signs ED Triage Vitals  Encounter  Vitals Group     BP 03/20/24 0846 113/78     Girls Systolic BP Percentile --      Girls Diastolic BP Percentile --      Boys Systolic BP Percentile --      Boys Diastolic BP Percentile --      Pulse Rate 03/20/24 0846 84     Resp 03/20/24 0846 18     Temp 03/20/24 0846 98.5 F (36.9 C)     Temp src --      SpO2 03/20/24 0846 99 %     Weight --      Height --      Head Circumference --      Peak Flow --      Pain Score 03/20/24 0843 8     Pain Loc --      Pain Education --      Exclude from Growth Chart --    No data found.  Updated Vital Signs BP 113/78 (BP Location: Right Arm)   Pulse 84   Temp 98.5 F (36.9 C)   Resp 18   LMP 03/04/2024 (Approximate)   SpO2 99%   Visual Acuity Right Eye Distance:   Left Eye  Distance:   Bilateral Distance:    Right Eye Near:   Left Eye Near:    Bilateral Near:     Physical Exam Constitutional:      Appearance: Normal appearance.  HENT:     Ears:     Comments: Ruth Finley to green purulent drainage present within the left ear canal, no abnormalities to the tympanic membrane, no external ear abnormality Eyes:     Extraocular Movements: Extraocular movements intact.  Pulmonary:     Effort: Pulmonary effort is normal.  Neurological:     General: No focal deficit present.     Mental Status: She is alert and oriented to person, place, and time. Mental status is at baseline.      UC Treatments / Results  Labs (all labs ordered are listed, but only abnormal results are displayed) Labs Reviewed - No data to display  EKG   Radiology No results found.  Procedures Procedures (including critical care time)  Medications Ordered in UC Medications - No data to display  Initial Impression / Assessment and Plan / UC Course  I have reviewed the triage vital signs and the nursing notes.  Pertinent labs & imaging results that were available during my care of the patient were reviewed by me and considered in my medical decision  making (see chart for details).  Infective otitis externa, left  Purulent drainage noted within the ear canal consistent with infection, discussed with patient, prescribed Ciprodex and discussed administration, recommended over-the-counter medication and nonpharmacological supportive care with follow-up as needed if symptoms do not improve, work note given Final Clinical Impressions(s) / UC Diagnoses   Final diagnoses:  Infective otitis externa, left     Discharge Instructions      Today we treated for an infection to the ear canal on the left side, able to see pus  Place 4 drops of Ciprodex into the ear every morning and every evening for 7 days, this is an antibiotic and a steroid ideally will help with pain  You may use Tylenol or ibuprofen for management of discomfort  May hold warm compresses to the ear for additional comfort  Please not attempted any ear cleaning or object or fluid placement into the ear canal to prevent further irritation  If you not see any improvement in your symptoms please follow-up for reevaluation   ED Prescriptions     Medication Sig Dispense Auth. Provider   ciprofloxacin-dexamethasone (CIPRODEX) OTIC suspension Place 4 drops into the left ear 2 (two) times daily. 7.5 mL Teresa Shelba SAUNDERS, NP      PDMP not reviewed this encounter.   Teresa Shelba SAUNDERS, [place] 03/20/24 [phone]

## 2024-03-20 NOTE — Discharge Instructions (Signed)
 Today we treated for an infection to the ear canal on the left side, able to see pus  Place 4 drops of Ciprodex into the ear every morning and every evening for 7 days, this is an antibiotic and a steroid ideally will help with pain  You may use Tylenol or ibuprofen for management of discomfort  May hold warm compresses to the ear for additional comfort  Please not attempted any ear cleaning or object or fluid placement into the ear canal to prevent further irritation  If you not see any improvement in your symptoms please follow-up for reevaluation

## 2024-03-20 NOTE — ED Triage Notes (Signed)
 Patient reports left ear pain and difficulty eating on left side and feels like something is swollen behind left ear 3-4 days. Rates pain 8/10. Patient took Tylenol om yesterday with no relief.

## 2024-03-26 ENCOUNTER — Ambulatory Visit: Admitting: Family Medicine

## 2025-01-24 ENCOUNTER — Encounter: Admitting: Family Medicine

## 2025-03-11 ENCOUNTER — Ambulatory Visit: Admitting: Neurosurgery
